# Patient Record
Sex: Male | Born: 1950 | Race: Black or African American | Hispanic: No | Smoking: Current every day smoker
Health system: Southern US, Community
[De-identification: ages and names within clinical notes are randomized; demographics above are authoritative.]

## PROBLEM LIST (undated history)

## (undated) DIAGNOSIS — C61 Malignant neoplasm of prostate: Secondary | ICD-10-CM

## (undated) DIAGNOSIS — K219 Gastro-esophageal reflux disease without esophagitis: Secondary | ICD-10-CM

## (undated) HISTORY — DX: Malignant neoplasm of prostate: C61

## (undated) HISTORY — PX: LAPAROTOMY: SHX154

---

## 1989-02-04 HISTORY — PX: BILROTH II PROCEDURE: SHX1232

## 1999-02-05 HISTORY — PX: COLONOSCOPY: SHX174

## 1999-04-26 ENCOUNTER — Other Ambulatory Visit: Admission: RE | Admit: 1999-04-26 | Discharge: 1999-04-26 | Payer: Self-pay | Admitting: Gastroenterology

## 1999-05-01 ENCOUNTER — Ambulatory Visit (HOSPITAL_COMMUNITY): Admission: RE | Admit: 1999-05-01 | Discharge: 1999-05-01 | Payer: Self-pay | Admitting: Gastroenterology

## 1999-05-01 ENCOUNTER — Encounter: Payer: Self-pay | Admitting: Gastroenterology

## 1999-05-08 ENCOUNTER — Other Ambulatory Visit: Admission: RE | Admit: 1999-05-08 | Discharge: 1999-05-08 | Payer: Self-pay | Admitting: Gastroenterology

## 2013-02-04 HISTORY — PX: OTHER SURGICAL HISTORY: SHX169

## 2014-09-17 ENCOUNTER — Other Ambulatory Visit: Payer: Self-pay | Admitting: Family

## 2015-11-24 LAB — BASIC METABOLIC PANEL: Glucose: 127 mg/dL

## 2015-12-25 ENCOUNTER — Encounter: Payer: Self-pay | Admitting: Gastroenterology

## 2016-01-11 ENCOUNTER — Ambulatory Visit: Payer: Self-pay | Admitting: Nurse Practitioner

## 2016-01-24 ENCOUNTER — Ambulatory Visit (INDEPENDENT_AMBULATORY_CARE_PROVIDER_SITE_OTHER): Payer: Non-veteran care | Admitting: Nurse Practitioner

## 2016-01-24 ENCOUNTER — Encounter: Payer: Self-pay | Admitting: Nurse Practitioner

## 2016-01-24 ENCOUNTER — Other Ambulatory Visit: Payer: Self-pay

## 2016-01-24 DIAGNOSIS — K219 Gastro-esophageal reflux disease without esophagitis: Secondary | ICD-10-CM

## 2016-01-24 DIAGNOSIS — Z86010 Personal history of colonic polyps: Secondary | ICD-10-CM | POA: Diagnosis not present

## 2016-01-24 MED ORDER — OMEPRAZOLE 20 MG PO CPDR: 20.0000 mg | DELAYED_RELEASE_CAPSULE | Freq: Every day | ORAL | 3 refills | Status: AC

## 2016-01-24 MED ORDER — PEG 3350-KCL-NA BICARB-NACL 420 G PO SOLR: 4000.0000 mL | ORAL | 0 refills | Status: DC
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
## 2016-01-24 NOTE — Patient Instructions (Signed)
1. We will schedule your procedure for you. 2. I will printout a prescription for Prilosec 20 mg. Take 1 pill, first thing in the morning, 30 minutes before your first meal the day. 3. Further recommendations to be based on the results of your colonoscopy. 4. Return for follow-up in 3 months.

## 2016-01-24 NOTE — Assessment & Plan Note (Signed)
Chronic GERD with symptoms with most meals. Currently takes Rolaids or Tums which helps his symptoms. Associated nausea. He is not currently on any prescription medications. At this point I will start him on Prilosec 20 mg once a day, 30 minutes before first meal the day. Return for follow-up in 3 months to further evaluate. Overall, no dysphagia symptoms, no other indication for upper endoscopy at this time. This could change depending on his symptom progression.

## 2016-01-24 NOTE — Assessment & Plan Note (Signed)
The patient has a history of colon polyps. Last colonoscopy 2001, 16 years ago. He is significantly overdue. He has been referred here by the [facility] due to their backlog of colonoscopy appointments. At this point he is generally asymptomatic from a GI standpoint with the exception of GERD for which we will manage as per above. We will proceed with colonoscopy as previously requested. Return for follow-up in 3 months.  Proceed with colonoscopy on propofol/MAC with Dr. Oneida Alar in the near future. The risks, benefits, and alternatives have been discussed in detail with the patient. They state understanding and desire to proceed.   The patient is not currently on any prescription medications. He drinks 2-3 drinks a day for a long period of time. Because of chronic alcohol use we will provide for the procedure on propofol/MAC to promote adequate sedation.

## 2016-01-25 NOTE — Progress Notes (Signed)
cc'ed to pcp °

## 2016-02-12 ENCOUNTER — Other Ambulatory Visit: Payer: Self-pay

## 2016-02-12 ENCOUNTER — Telehealth: Payer: Self-pay

## 2016-02-12 DIAGNOSIS — Z86010 Personal history of colonic polyps: Secondary | ICD-10-CM

## 2016-02-12 NOTE — Telephone Encounter (Signed)
Pt called office to schedule TCS. TCS with Propofol with Dr. Oneida Alar scheduled for 02/20/16 at 11:00 am. Pt already has his prep. Orders entered. Instructions mailed. Pre-op scheduled for 02/15/16 at 8:00 am. Called pt to inform of pre-op appt. Pt wanted to reschedule pre-op. Gave pt phone number for Endo scheduler to reschedule pre-op.

## 2016-02-13 NOTE — Patient Instructions (Signed)
Dominic Martin  02/13/2016     @PREFPERIOPPHARMACY @   Your procedure is scheduled on  02/20/2016  Report to [facility] at  915  A.M.  Call this number if you have problems the morning of surgery:  [phone]   Remember:  Do not eat food or drink liquids after midnight.  Take these medicines the morning of surgery with A SIP OF WATER  Prilosec   Do not wear jewelry, make-up or nail polish.  Do not wear lotions, powders, or perfumes, or deoderant.  Do not shave 48 hours prior to surgery.  Men may shave face and neck.  Do not bring valuables to the hospital.  [facility] is not responsible for any belongings or valuables.  Contacts, dentures or bridgework may not be worn into surgery.  Leave your suitcase in the car.  After surgery it may be brought to your room.  For patients admitted to the hospital, discharge time will be determined by your treatment team.  Patients discharged the day of surgery will not be allowed to drive home.   Name and phone number of your driver:   family Special instructions:  Follow the diet and prep instructions given to you by Dr Nona Dell office.  Please read over the following fact sheets that you were given. Anesthesia Post-op Instructions and Care and Recovery After Surgery       Colonoscopy, Adult A colonoscopy is an exam to look at the entire large intestine. During the exam, a lubricated, bendable tube is inserted into the anus and then passed into the rectum, colon, and other parts of the large intestine. A colonoscopy is often done as a part of normal colorectal screening or in response to certain symptoms, such as anemia, persistent diarrhea, abdominal pain, and blood in the stool. The exam can help screen for and diagnose medical problems, including:  Tumors.  Polyps.  Inflammation.  Areas of bleeding. Tell a health care provider about:  Any allergies you have.  All medicines you are taking, including vitamins,  herbs, eye drops, creams, and over-the-counter medicines.  Any problems you or family members have had with anesthetic medicines.  Any blood disorders you have.  Any surgeries you have had.  Any medical conditions you have.  Any problems you have had passing stool. What are the risks? Generally, this is a safe procedure. However, problems may occur, including:  Bleeding.  A tear in the intestine.  A reaction to medicines given during the exam.  Infection (rare). What happens before the procedure? Eating and drinking restrictions  Follow instructions from your health care provider about eating and drinking, which may include:  A few days before the procedure - follow a low-fiber diet. Avoid nuts, seeds, dried fruit, raw fruits, and vegetables.  1-3 days before the procedure - follow a clear liquid diet. Drink only clear liquids, such as clear broth or bouillon, black coffee or tea, clear juice, clear soft drinks or sports drinks, gelatin desert, and popsicles. Avoid any liquids that contain red or purple dye.  On the day of the procedure - do not eat or drink anything during the 2 hours before the procedure, or within the time period that your health care provider recommends. Bowel prep  If you were prescribed an oral bowel prep to clean out your colon:  Take it as told by your health care provider. Starting the day before your procedure, you will need to  drink a large amount of medicated liquid. The liquid will cause you to have multiple loose stools until your stool is almost clear or light green.  If your skin or anus gets irritated from diarrhea, you may use these to relieve the irritation:  Medicated wipes, such as adult wet wipes with aloe and vitamin E.  A skin soothing-product like petroleum jelly.  If you vomit while drinking the bowel prep, take a break for up to 60 minutes and then begin the bowel prep again. If vomiting continues and you cannot take the bowel prep  without vomiting, call your health care provider. General instructions  Ask your health care provider about changing or stopping your regular medicines. This is especially important if you are taking diabetes medicines or blood thinners.  Plan to have someone take you home from the hospital or clinic. What happens during the procedure?  An IV tube may be inserted into one of your veins.  You will be given medicine to help you relax (sedative).  To reduce your risk of infection:  Your health care team will wash or sanitize their hands.  Your anal area will be washed with soap.  You will be asked to lie on your side with your knees bent.  Your health care provider will lubricate a long, thin, flexible tube. The tube will have a camera and a light on the end.  The tube will be inserted into your anus.  The tube will be gently eased through your rectum and colon.  Air will be delivered into your colon to keep it open. You may feel some pressure or cramping.  The camera will be used to take images during the procedure.  A small tissue sample may be removed from your body to be examined under a microscope (biopsy). If any potential problems are found, the tissue will be sent to a lab for testing.  If small polyps are found, your health care provider may remove them and have them checked for cancer cells.  The tube that was inserted into your anus will be slowly removed. The procedure may vary among health care providers and hospitals. What happens after the procedure?  Your blood pressure, heart rate, breathing rate, and blood oxygen level will be monitored until the medicines you were given have worn off.  Do not drive for 24 hours after the exam.  You may have a small amount of blood in your stool.  You may pass gas and have mild abdominal cramping or bloating due to the air that was used to inflate your colon during the exam.  It is up to you to get the results of your  procedure. Ask your health care provider, or the department performing the procedure, when your results will be ready. This information is not intended to replace advice given to you by your health care provider. Make sure you discuss any questions you have with your health care provider. Document Released: 01/19/2000 Document Revised: 08/11/2015 Document Reviewed: 04/04/2015 Elsevier Interactive Patient Education  2017 [employer]  Colonoscopy, Adult, Care After This sheet gives you information about how to care for yourself after your procedure. Your health care provider may also give you more specific instructions. If you have problems or questions, contact your health care provider. What can I expect after the procedure? After the procedure, it is common to have:  A small amount of blood in your stool for 24 hours after the procedure.  Some gas.  Mild abdominal  cramping or bloating. Follow these instructions at home: General instructions  For the first 24 hours after the procedure:  Do not drive or use machinery.  Do not sign important documents.  Do not drink alcohol.  Do your regular daily activities at a slower pace than normal.  Eat soft, easy-to-digest foods.  Rest often.  Take over-the-counter or prescription medicines only as told by your health care provider.  It is up to you to get the results of your procedure. Ask your health care provider, or the department performing the procedure, when your results will be ready. Relieving cramping and bloating  Try walking around when you have cramps or feel bloated.  Apply heat to your abdomen as told by your health care provider. Use a heat source that your health care provider recommends, such as a moist heat pack or a heating pad.  Place a towel between your skin and the heat source.  Leave the heat on for 20-30 minutes.  Remove the heat if your skin turns bright red. This is especially important if you are  unable to feel pain, heat, or cold. You may have a greater risk of getting burned. Eating and drinking  Drink enough fluid to keep your urine clear or pale yellow.  Resume your normal diet as instructed by your health care provider. Avoid heavy or fried foods that are hard to digest.  Avoid drinking alcohol for as long as instructed by your health care provider. Contact a health care provider if:  You have blood in your stool 2-3 days after the procedure. Get help right away if:  You have more than a small spotting of blood in your stool.  You pass large blood clots in your stool.  Your abdomen is swollen.  You have nausea or vomiting.  You have a fever.  You have increasing abdominal pain that is not relieved with medicine. This information is not intended to replace advice given to you by your health care provider. Make sure you discuss any questions you have with your health care provider. Document Released: 09/05/2003 Document Revised: 10/16/2015 Document Reviewed: 04/04/2015 Elsevier Interactive Patient Education  2017 [place] Anesthesia is a term that refers to techniques, procedures, and medicines that help a person stay safe and comfortable during a medical procedure. Monitored anesthesia care, or sedation, is one type of anesthesia. Your anesthesia specialist may recommend sedation if you will be having a procedure that does not require you to be unconscious, such as:  Cataract surgery.  A dental procedure.  A biopsy.  A colonoscopy. During the procedure, you may receive a medicine to help you relax (sedative). There are three levels of sedation:  Mild sedation. At this level, you may feel awake and relaxed. You will be able to follow directions.  Moderate sedation. At this level, you will be sleepy. You may not remember the procedure.  Deep sedation. At this level, you will be asleep. You will not remember the procedure. The  more medicine you are given, the deeper your level of sedation will be. Depending on how you respond to the procedure, the anesthesia specialist may change your level of sedation or the type of anesthesia to fit your needs. An anesthesia specialist will monitor you closely during the procedure. Let your health care provider know about:  Any allergies you have.  All medicines you are taking, including vitamins, herbs, eye drops, creams, and over-the-counter medicines.  Any use of steroids (by mouth  or as a cream).  Any problems you or family members have had with sedatives and anesthetic medicines.  Any blood disorders you have.  Any surgeries you have had.  Any medical conditions you have, such as sleep apnea.  Whether you are pregnant or may be pregnant.  Any use of cigarettes, alcohol, or street drugs. What are the risks? Generally, this is a safe procedure. However, problems may occur, including:  Getting too much medicine (oversedation).  Nausea.  Allergic reaction to medicines.  Trouble breathing. If this happens, a breathing tube may be used to help with breathing. It will be removed when you are awake and breathing on your own.  Heart trouble.  Lung trouble. Before the procedure Staying hydrated  Follow instructions from your health care provider about hydration, which may include:  Up to 2 hours before the procedure - you may continue to drink clear liquids, such as water, clear fruit juice, black coffee, and plain tea. Eating and drinking restrictions  Follow instructions from your health care provider about eating and drinking, which may include:  8 hours before the procedure - stop eating heavy meals or foods such as meat, fried foods, or fatty foods.  6 hours before the procedure - stop eating light meals or foods, such as toast or cereal.  6 hours before the procedure - stop drinking milk or drinks that contain milk.  2 hours before the procedure - stop  drinking clear liquids. Medicines  Ask your health care provider about:  Changing or stopping your regular medicines. This is especially important if you are taking diabetes medicines or blood thinners.  Taking medicines such as aspirin and ibuprofen. These medicines can thin your blood. Do not take these medicines before your procedure if your health care provider instructs you not to. Tests and exams  You will have a physical exam.  You may have blood tests done to show:  How well your kidneys and liver are working.  How well your blood can clot.  General instructions  Plan to have someone take you home from the hospital or clinic.  If you will be going home right after the procedure, plan to have someone with you for 24 hours. What happens during the procedure?  Your blood pressure, heart rate, breathing, level of pain and overall condition will be monitored.  An IV tube will be inserted into one of your veins.  Your anesthesia specialist will give you medicines as needed to keep you comfortable during the procedure. This may mean changing the level of sedation.  The procedure will be performed. After the procedure  Your blood pressure, heart rate, breathing rate, and blood oxygen level will be monitored until the medicines you were given have worn off.  Do not drive for 24 hours if you received a sedative.  You may:  Feel sleepy, clumsy, or nauseous.  Feel forgetful about what happened after the procedure.  Have a sore throat if you had a breathing tube during the procedure.  Vomit. This information is not intended to replace advice given to you by your health care provider. Make sure you discuss any questions you have with your health care provider. Document Released: 10/17/2004 Document Revised: 06/30/2015 Document Reviewed: 05/14/2015 Elsevier Interactive Patient Education  2017 [place], Care After These instructions provide you  with information about caring for yourself after your procedure. Your health care provider may also give you more specific instructions. Your treatment has been planned according  to current medical practices, but problems sometimes occur. Call your health care provider if you have any problems or questions after your procedure. What can I expect after the procedure? After your procedure, it is common to:  Feel sleepy for several hours.  Feel clumsy and have poor balance for several hours.  Feel forgetful about what happened after the procedure.  Have poor judgment for several hours.  Feel nauseous or vomit.  Have a sore throat if you had a breathing tube during the procedure. Follow these instructions at home: For at least 24 hours after the procedure:   Do not:  Participate in activities in which you could fall or become injured.  Drive.  Use heavy machinery.  Drink alcohol.  Take sleeping pills or medicines that cause drowsiness.  Make important decisions or sign legal documents.  Take care of children on your own.  Rest. Eating and drinking  Follow the diet that is recommended by your health care provider.  If you vomit, drink water, juice, or soup when you can drink without vomiting.  Make sure you have little or no nausea before eating solid foods. General instructions  Have a responsible adult stay with you until you are awake and alert.  Take over-the-counter and prescription medicines only as told by your health care provider.  If you smoke, do not smoke without supervision.  Keep all follow-up visits as told by your health care provider. This is important. Contact a health care provider if:  You keep feeling nauseous or you keep vomiting.  You feel light-headed.  You develop a rash.  You have a fever. Get help right away if:  You have trouble breathing. This information is not intended to replace advice given to you by your health care provider.  Make sure you discuss any questions you have with your health care provider. Document Released: 05/14/2015 Document Revised: 09/13/2015 Document Reviewed: 05/14/2015 Elsevier Interactive Patient Education  2017 [employer].

## 2016-02-15 ENCOUNTER — Other Ambulatory Visit (HOSPITAL_COMMUNITY): Payer: No Typology Code available for payment source

## 2016-02-16 ENCOUNTER — Encounter (HOSPITAL_COMMUNITY)
Admission: RE | Admit: 2016-02-16 | Discharge: 2016-02-16 | Disposition: A | Discharge status: Home / Self Care | Payer: Non-veteran care | Source: Ambulatory Visit | Attending: Gastroenterology | Admitting: Gastroenterology

## 2016-02-16 ENCOUNTER — Encounter (HOSPITAL_COMMUNITY): Payer: Self-pay

## 2016-02-16 DIAGNOSIS — K635 Polyp of colon: Secondary | ICD-10-CM | POA: Diagnosis not present

## 2016-02-16 DIAGNOSIS — K219 Gastro-esophageal reflux disease without esophagitis: Secondary | ICD-10-CM | POA: Diagnosis not present

## 2016-02-16 DIAGNOSIS — Z01812 Encounter for preprocedural laboratory examination: Secondary | ICD-10-CM | POA: Diagnosis present

## 2016-02-16 DIAGNOSIS — C61 Malignant neoplasm of prostate: Secondary | ICD-10-CM | POA: Diagnosis not present

## 2016-02-16 HISTORY — DX: Gastro-esophageal reflux disease without esophagitis: K21.9

## 2016-02-16 LAB — BASIC METABOLIC PANEL
Anion gap: 7 (ref 5–15)
BUN: 9 mg/dL (ref 6–20)
CALCIUM: 9.1 mg/dL (ref 8.9–10.3)
CHLORIDE: 103 mmol/L (ref 101–111)
CO2: 24 mmol/L (ref 22–32)
CREATININE: 0.85 mg/dL (ref 0.61–1.24)
GFR calc Af Amer: >60 mL/min (ref >60)
GFR calc non Af Amer: >60 mL/min (ref >60)
Glucose, Bld: 103 mg/dL — ABNORMAL HIGH (ref 65–99)
Potassium: 3.4 mmol/L — ABNORMAL LOW (ref 3.5–5.1)
Sodium: 134 mmol/L — ABNORMAL LOW (ref 135–145)

## 2016-02-16 LAB — CBC WITH DIFFERENTIAL/PLATELET
BASOS PCT: 0 %
Basophils Absolute: 0 10*3/uL (ref 0.0–0.1)
Eosinophils Absolute: 0.1 10*3/uL (ref 0.0–0.7)
Eosinophils Relative: 1 %
HEMATOCRIT: 40.8 % (ref 39.0–52.0)
HEMOGLOBIN: 14.5 g/dL (ref 13.0–17.0)
LYMPHS ABS: 2.7 10*3/uL (ref 0.7–4.0)
Lymphocytes Relative: 36 %
MCH: 34.5 pg — AB (ref 26.0–34.0)
MCHC: 35.5 g/dL (ref 30.0–36.0)
MCV: 97.1 fL (ref 78.0–100.0)
MONO ABS: 0.8 10*3/uL (ref 0.1–1.0)
MONOS PCT: 10 %
NEUTROS PCT: 53 %
Neutro Abs: 4 10*3/uL (ref 1.7–7.7)
Platelets: 253 10*3/uL (ref 150–400)
RBC: 4.2 MIL/uL — ABNORMAL LOW (ref 4.22–5.81)
RDW: 14.5 % (ref 11.5–15.5)
WBC: 7.6 10*3/uL (ref 4.0–10.5)

## 2016-02-20 ENCOUNTER — Ambulatory Visit (HOSPITAL_COMMUNITY): Payer: Non-veteran care | Admitting: Anesthesiology

## 2016-02-20 ENCOUNTER — Encounter (HOSPITAL_COMMUNITY): Payer: Self-pay

## 2016-02-20 ENCOUNTER — Encounter (HOSPITAL_COMMUNITY)
Admission: RE | Disposition: A | Discharge status: Home / Self Care | Payer: Self-pay | Source: Ambulatory Visit | Attending: Gastroenterology

## 2016-02-20 ENCOUNTER — Ambulatory Visit (HOSPITAL_COMMUNITY)
Admission: RE | Admit: 2016-02-20 | Discharge: 2016-02-20 | Disposition: A | Discharge status: Home / Self Care | Payer: Non-veteran care | Source: Ambulatory Visit | Attending: Gastroenterology | Admitting: Gastroenterology

## 2016-02-20 DIAGNOSIS — K219 Gastro-esophageal reflux disease without esophagitis: Secondary | ICD-10-CM | POA: Diagnosis not present

## 2016-02-20 DIAGNOSIS — Z86010 Personal history of colonic polyps: Secondary | ICD-10-CM

## 2016-02-20 DIAGNOSIS — K648 Other hemorrhoids: Secondary | ICD-10-CM | POA: Diagnosis not present

## 2016-02-20 DIAGNOSIS — Z1211 Encounter for screening for malignant neoplasm of colon: Secondary | ICD-10-CM | POA: Insufficient documentation

## 2016-02-20 DIAGNOSIS — D124 Benign neoplasm of descending colon: Secondary | ICD-10-CM | POA: Diagnosis not present

## 2016-02-20 DIAGNOSIS — Z8719 Personal history of other diseases of the digestive system: Secondary | ICD-10-CM | POA: Insufficient documentation

## 2016-02-20 DIAGNOSIS — F17210 Nicotine dependence, cigarettes, uncomplicated: Secondary | ICD-10-CM | POA: Diagnosis not present

## 2016-02-20 DIAGNOSIS — D120 Benign neoplasm of cecum: Secondary | ICD-10-CM | POA: Diagnosis not present

## 2016-02-20 DIAGNOSIS — Q439 Congenital malformation of intestine, unspecified: Secondary | ICD-10-CM | POA: Insufficient documentation

## 2016-02-20 HISTORY — PX: COLONOSCOPY WITH PROPOFOL: SHX5780

## 2016-02-20 HISTORY — PX: POLYPECTOMY: SHX5525

## 2016-02-20 SURGERY — COLONOSCOPY WITH PROPOFOL
Anesthesia: Monitor Anesthesia Care

## 2016-02-20 MED ORDER — MIDAZOLAM HCL 2 MG/2ML IJ SOLN
INTRAMUSCULAR | Status: AC
  Filled 2016-02-20: qty 2

## 2016-02-20 MED ORDER — MIDAZOLAM HCL 5 MG/5ML IJ SOLN
INTRAMUSCULAR | Status: DC | PRN
  Administered 2016-02-20: 2 mg via INTRAVENOUS

## 2016-02-20 MED ORDER — PROPOFOL 10 MG/ML IV BOLUS
INTRAVENOUS | Status: AC
  Filled 2016-02-20: qty 40

## 2016-02-20 MED ORDER — CHLORHEXIDINE GLUCONATE CLOTH 2 % EX PADS: 6.0000 | MEDICATED_PAD | Freq: Once | CUTANEOUS | Status: DC

## 2016-02-20 MED ORDER — LACTATED RINGERS IV SOLN
INTRAVENOUS | Status: DC
  Administered 2016-02-20: 10:00:00 via INTRAVENOUS

## 2016-02-20 MED ORDER — FENTANYL CITRATE (PF) 100 MCG/2ML IJ SOLN
INTRAMUSCULAR | Status: AC
  Filled 2016-02-20: qty 2

## 2016-02-20 MED ORDER — PROPOFOL 500 MG/50ML IV EMUL
INTRAVENOUS | Status: DC | PRN
  Administered 2016-02-20: 125 ug/kg/min via INTRAVENOUS
  Administered 2016-02-20: 11:00:00 via INTRAVENOUS

## 2016-02-20 MED ORDER — MIDAZOLAM HCL 2 MG/2ML IJ SOLN
0.5000 mg | INTRAMUSCULAR | Status: DC | PRN
  Administered 2016-02-20: 2 mg via INTRAVENOUS
  Filled 2016-02-20: qty 2

## 2016-02-20 MED ORDER — FENTANYL CITRATE (PF) 100 MCG/2ML IJ SOLN
25.0000 ug | INTRAMUSCULAR | Status: AC | PRN
  Administered 2016-02-20 (×2): 25 ug via INTRAVENOUS

## 2016-02-20 NOTE — Discharge Instructions (Signed)
You had 2 polyps removed. You have MODERATE SIZE internal hemorrhoids.    DRINK WATER TO KEEP YOUR URINE LIGHT YELLOW.  FOLLOW A HIGH FIBER DIET. AVOID ITEMS THAT CAUSE BLOATING & GAS. SEE INFO BELOW.  YOUR BIOPSY RESULTS WILL BE AVAILABLE IN MY CHART AFTER JAN 19 AND MY OFFICE WILL CONTACT YOU IN 10-14 DAYS WITH YOUR RESULTS.   Next colonoscopy in 5-10 years.    Colonoscopy Care After Read the instructions outlined below and refer to this sheet in the next week. These discharge instructions provide you with general information on caring for yourself after you leave the hospital. While your treatment has been planned according to the most current medical practices available, unavoidable complications occasionally occur. If you have any problems or questions after discharge, call DR. Vaibhav Fogleman, [phone].  ACTIVITY  You may resume your regular activity, but move at a slower pace for the next 24 hours.   Take frequent rest periods for the next 24 hours.   Walking will help get rid of the air and reduce the bloated feeling in your belly (abdomen).   No driving for 24 hours (because of the medicine (anesthesia) used during the test).   You may shower.   Do not sign any important legal documents or operate any machinery for 24 hours (because of the anesthesia used during the test).    NUTRITION  Drink plenty of fluids.   You may resume your normal diet as instructed by your doctor.   Begin with a light meal and progress to your normal diet. Heavy or fried foods are harder to digest and may make you feel sick to your stomach (nauseated).   Avoid alcoholic beverages for 24 hours or as instructed.    MEDICATIONS  You may resume your normal medications.   WHAT YOU CAN EXPECT TODAY  Some feelings of bloating in the abdomen.   Passage of more gas than usual.   Spotting of blood in your stool or on the toilet paper  .  IF YOU HAD POLYPS REMOVED DURING THE  COLONOSCOPY:  Eat a soft diet IF YOU HAVE NAUSEA, BLOATING, ABDOMINAL PAIN, OR VOMITING.    FINDING OUT THE RESULTS OF YOUR TEST Not all test results are available during your visit. DR. Oneida Alar WILL CALL YOU WITHIN 14 DAYS OF YOUR PROCEDUE WITH YOUR RESULTS. Do not assume everything is normal if you have not heard from DR. Prisma Decarlo, CALL HER OFFICE AT [phone].  SEEK IMMEDIATE MEDICAL ATTENTION AND CALL THE OFFICE: [phone] IF:  You have more than a spotting of blood in your stool.   Your belly is swollen (abdominal distention).   You are nauseated or vomiting.   You have a temperature over 101F.   You have abdominal pain or discomfort that is severe or gets worse throughout the day.   High-Fiber Diet A high-fiber diet changes your normal diet to include more whole grains, legumes, fruits, and vegetables. Changes in the diet involve replacing refined carbohydrates with unrefined foods. The calorie level of the diet is essentially unchanged. The Dietary Reference Intake (recommended amount) for adult males is 38 grams per day. For adult females, it is 25 grams per day. Pregnant and lactating women should consume 28 grams of fiber per day. Fiber is the intact part of a plant that is not broken down during digestion. Functional fiber is fiber that has been isolated from the plant to provide a beneficial effect in the body. PURPOSE  Increase stool bulk.  Ease and regulate bowel movements.   Lower cholesterol.   REDUCE RISK OF COLON CANCER  INDICATIONS THAT YOU NEED MORE FIBER  Constipation and hemorrhoids.   Uncomplicated diverticulosis (intestine condition) and irritable bowel syndrome.   Weight management.   As a protective measure against hardening of the arteries (atherosclerosis), diabetes, and cancer.   GUIDELINES FOR INCREASING FIBER IN THE DIET  Start adding fiber to the diet slowly. A gradual increase of about 5 more grams (2 slices of whole-wheat bread, 2  servings of most fruits or vegetables, or 1 bowl of high-fiber cereal) per day is best. Too rapid an increase in fiber may result in constipation, flatulence, and bloating.   Drink enough water and fluids to keep your urine clear or pale yellow. Water, juice, or caffeine-free drinks are recommended. Not drinking enough fluid may cause constipation.   Eat a variety of high-fiber foods rather than one type of fiber.   Try to increase your intake of fiber through using high-fiber foods rather than fiber pills or supplements that contain small amounts of fiber.   The goal is to change the types of food eaten. Do not supplement your present diet with high-fiber foods, but replace foods in your present diet.   INCLUDE A VARIETY OF FIBER SOURCES  Replace refined and processed grains with whole grains, canned fruits with fresh fruits, and incorporate other fiber sources. White rice, white breads, and most bakery goods contain little or no fiber.   Brown whole-grain rice, buckwheat oats, and many fruits and vegetables are all good sources of fiber. These include: broccoli, Brussels sprouts, cabbage, cauliflower, beets, sweet potatoes, white potatoes (skin on), carrots, tomatoes, eggplant, squash, berries, fresh fruits, and dried fruits.   Cereals appear to be the richest source of fiber. Cereal fiber is found in whole grains and bran. Bran is the fiber-rich outer coat of cereal grain, which is largely removed in refining. In whole-grain cereals, the bran remains. In breakfast cereals, the largest amount of fiber is found in those with "bran" in their names. The fiber content is sometimes indicated on the label.   You may need to include additional fruits and vegetables each day.   In baking, for 1 cup white flour, you may use the following substitutions:   1 cup whole-wheat flour minus 2 tablespoons.   1/2 cup white flour plus 1/2 cup whole-wheat flour.   Polyps, Colon  A polyp is extra tissue that  grows inside your body. Colon polyps grow in the large intestine. The large intestine, also called the colon, is part of your digestive system. It is a long, hollow tube at the end of your digestive tract where your body makes and stores stool. Most polyps are not dangerous. They are benign. This means they are not cancerous. But over time, some types of polyps can turn into cancer. Polyps that are smaller than a pea are usually not harmful. But larger polyps could someday become or may already be cancerous. To be safe, doctors remove all polyps and test them.   WHO GETS POLYPS? Anyone can get polyps, but certain people are more likely than others. You may have a greater chance of getting polyps if:  You are over 50.   You have had polyps before.   Someone in your family has had polyps.   Someone in your family has had cancer of the large intestine.   Find out if someone in your family has had polyps. You may also be  more likely to get polyps if you:   Eat a lot of fatty foods   Smoke   Drink alcohol   Do not exercise  Eat too much   PREVENTION There is not one sure way to prevent polyps. You might be able to lower your risk of getting them if you:  Eat more fruits and vegetables and less fatty food.   Do not smoke.   Avoid alcohol.   Exercise every day.   Lose weight if you are overweight.   Eating more calcium and folate can also lower your risk of getting polyps. Some foods that are rich in calcium are milk, cheese, and broccoli. Some foods that are rich in folate are chickpeas, kidney beans, and spinach.   Hemorrhoids Hemorrhoids are dilated (enlarged) veins around the rectum. Sometimes clots will form in the veins. This makes them swollen and painful. These are called thrombosed hemorrhoids. Causes of hemorrhoids include:  Constipation.   Straining to have a bowel movement.   HEAVY LIFTING  HOME CARE INSTRUCTIONS  Eat a well balanced diet and drink 6 to 8 glasses  of water every day to avoid constipation. You may also use a bulk laxative.   Avoid straining to have bowel movements.   Keep anal area dry and clean.   Do not use a donut shaped pillow or sit on the toilet for long periods. This increases blood pooling and pain.   Move your bowels when your body has the urge; this will require less straining and will decrease pain and pressure.
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
## 2016-02-20 NOTE — Op Note (Signed)
[facility] Patient Name: Dominic Martin Procedure Date: 02/20/2016 10:46 AM MRN: [id number] Date of Birth: [birth year] Attending MD: Barney Drain , MD CSN: [id number] Age: 66 Admit Type: Outpatient Procedure:                Colonoscopy WITH COLD FORCEPS/SNARE POLYPECTOMY Indications:              Surveillance: Personal history of colonic polyps                            (unknown histology) on last colonoscopy more than 5                            years ago Providers:                Barney Drain, MD, Otis Peak B. Sharon Seller, RN, Randa Spike, Technician Referring MD:             [place] Medicines:                Propofol per Anesthesia Complications:            No immediate complications. Estimated Blood Loss:     Estimated blood loss was minimal. Procedure:                Pre-Anesthesia Assessment:                           - Prior to the procedure, a History and Physical                            was performed, and patient medications and                            allergies were reviewed. The patient's tolerance of                            previous anesthesia was also reviewed. The risks                            and benefits of the procedure and the sedation                            options and risks were discussed with the patient.                            All questions were answered, and informed consent                            was obtained. Prior Anticoagulants: The patient has                            taken no previous anticoagulant or antiplatelet  agents. ASA Grade Assessment: I - A normal, healthy                            patient. After reviewing the risks and benefits,                            the patient was deemed in satisfactory condition to                            undergo the procedure. After obtaining informed                            consent, the colonoscope was passed under  direct                            vision. Throughout the procedure, the patient's                            blood pressure, pulse, and oxygen saturations were                            monitored continuously. The [id number]                            scope was introduced through the anus and advanced                            to the the cecum, identified by appendiceal orifice                            and ileocecal valve. The ileocecal valve,                            appendiceal orifice, and rectum were photographed.                            The colonoscopy was somewhat difficult due to a                            tortuous colon. Successful completion of the                            procedure was aided by COLOWRAP. The patient                            tolerated the procedure well. The quality of the                            bowel preparation was excellent. Scope In: 10:55:16 AM Scope Out: 11:14:31 AM Scope Withdrawal Time: 0 hours 16 minutes 2 seconds  Total Procedure Duration: 0 hours 19 minutes 15 seconds  Findings:      A 6 mm polyp was found in the cecum. The polyp was sessile. The polyp  was removed with a hot snare(20W). Resection and retrieval were complete.      A 3 mm polyp was found in the mid descending colon. The polyp was       sessile. The polyp was removed with a cold biopsy forceps. Resection and       retrieval were complete.      Internal hemorrhoids were found during retroflexion. The hemorrhoids       were moderate. Impression:               - One 6 mm polyp in the cecum, removed with a hot                            snare. Resected and retrieved.                           - One 3 mm polyp in the mid descending colon,                            removed with a cold biopsy forceps. Resected and                            retrieved.                           - Internal hemorrhoids. Moderate Sedation:      Per Anesthesia  Care Recommendation:           - High fiber diet.                           - Continue present medications.                           - Await pathology results.                           - Repeat colonoscopy in 5-10 years for surveillance.                           - Patient has a contact number available for                            emergencies. The signs and symptoms of potential                            delayed complications were discussed with the                            patient. Return to normal activities tomorrow.                            Written discharge instructions were provided to the                            patient. Procedure Code(s):        --- Professional ---  45385, Colonoscopy, flexible; with removal of                            tumor(s), polyp(s), or other lesion(s) by snare                            technique                           45380, 59, Colonoscopy, flexible; with biopsy,                            single or multiple Diagnosis Code(s):        --- Professional ---                           Z86.010, Personal history of colonic polyps                           D12.0, Benign neoplasm of cecum                           D12.4, Benign neoplasm of descending colon                           K64.8, Other hemorrhoids CPT copyright 2016 American [employer] Association. All rights reserved. The codes documented in this report are preliminary and upon coder review may  be revised to meet current compliance requirements. Barney Drain, MD Barney Drain, MD 02/20/2016 11:29:04 AM This report has been signed electronically. Number of Addenda: 0

## 2016-02-20 NOTE — Anesthesia Postprocedure Evaluation (Signed)
Anesthesia Post Note  Patient: Dominic Martin  Procedure(s) Performed: Procedure(s) (LRB): COLONOSCOPY WITH PROPOFOL (N/A) POLYPECTOMY  Patient location during evaluation: PACU Anesthesia Type: MAC Level of consciousness: awake, oriented and patient cooperative Pain management: pain level controlled Vital Signs Assessment: post-procedure vital signs reviewed and stable Respiratory status: spontaneous breathing, nonlabored ventilation and respiratory function stable Cardiovascular status: blood pressure returned to baseline Postop Assessment: no signs of nausea or vomiting Anesthetic complications: no     Last Vitals:  Vitals:   02/20/16 1035 02/20/16 1122  BP: 112/79 92/67  Pulse:  74  Resp: (!) 0 16  Temp:  36.6 C    Last Pain: There were no vitals filed for this visit.               Meara Wiechman J

## 2016-02-20 NOTE — Anesthesia Preprocedure Evaluation (Signed)
Anesthesia Evaluation  Patient identified by MRN, date of birth, ID band Patient awake    Reviewed: Allergy & Precautions, NPO status , Patient's Chart, lab work & pertinent test results  Airway Mallampati: I  TM Distance: >3 FB     Dental  (+) Teeth Intact   Pulmonary Current Smoker (am cough),    breath sounds clear to auscultation       Cardiovascular negative cardio ROS   Rhythm:Regular Rate:Normal     Neuro/Psych    GI/Hepatic GERD  ,  Endo/Other    Renal/GU      Musculoskeletal   Abdominal   Peds  Hematology   Anesthesia Other Findings   Reproductive/Obstetrics                             Anesthesia Physical Anesthesia Plan  ASA: II  Anesthesia Plan: MAC   Post-op Pain Management:    Induction: Intravenous  Airway Management Planned: Simple Face Mask  Additional Equipment:   Intra-op Plan:   Post-operative Plan:   Informed Consent: I have reviewed the patients History and Physical, chart, labs and discussed the procedure including the risks, benefits and alternatives for the proposed anesthesia with the patient or authorized representative who has indicated his/her understanding and acceptance.     Plan Discussed with:   Anesthesia Plan Comments:         Anesthesia Quick Evaluation

## 2016-02-20 NOTE — Transfer of Care (Signed)
Immediate Anesthesia Transfer of Care Note  Patient: Dominic Martin  Procedure(s) Performed: Procedure(s) with comments: COLONOSCOPY WITH PROPOFOL (N/A) - 11:00 am POLYPECTOMY - colon  Patient Location: PACU  Anesthesia Type:MAC  Level of Consciousness: sedated  Airway & Oxygen Therapy: Patient Spontanous Breathing and Patient connected to face mask oxygen  Post-op Assessment: Report given to RN, Post -op Vital signs reviewed and stable and Patient moving all extremities  Post vital signs: Reviewed and stable  Last Vitals:  Vitals:   02/20/16 1030 02/20/16 1035  BP: 106/72 112/79  Pulse:    Resp: 18 (!) 0  Temp:      Last Pain: There were no vitals filed for this visit.    Patients Stated Pain Goal: 4 (22/48/25 0037)  Complications: No apparent anesthesia complications

## 2016-02-22 ENCOUNTER — Encounter (HOSPITAL_COMMUNITY): Payer: Self-pay | Admitting: Gastroenterology

## 2016-02-26 ENCOUNTER — Telehealth: Payer: Self-pay | Admitting: Gastroenterology

## 2016-02-26 NOTE — Telephone Encounter (Signed)
Please call pt. HE had simple TWO adenomas removed.   DRINK WATER TO KEEP YOUR URINE LIGHT YELLOW.  FOLLOW A HIGH FIBER DIET. AVOID ITEMS THAT CAUSE BLOATING & GAS.   Next colonoscopy in 5-10 years.

## 2016-02-26 NOTE — Telephone Encounter (Signed)
LMOM to call.

## 2016-02-26 NOTE — Telephone Encounter (Signed)
Tried to call with no answer  

## 2016-02-26 NOTE — Telephone Encounter (Signed)
Reminder in epic °

## 2016-03-05 NOTE — Telephone Encounter (Signed)
PT called and is aware of his results. High Fiber diet mailed to him.

## 2016-03-20 ENCOUNTER — Telehealth: Payer: Self-pay

## 2016-03-20 NOTE — Telephone Encounter (Signed)
Pt had listed to old VM and he is aware of results and has received his diet in the mail.

## 2016-03-20 NOTE — Telephone Encounter (Signed)
PATIENT CALLED AND STATED HE HAD A MESSAGE THAT WE HAD INFO FOR HIM.  PLEASE CALL HIM BACK.

## 2016-03-20 NOTE — Telephone Encounter (Signed)
I left Vm for a return call. I had spoke to pt on 03/05/2016 and gave him results from colonoscopy and mailed him a letter. Don't see that anyone has called him since then.

## 2016-04-23 ENCOUNTER — Ambulatory Visit: Payer: No Typology Code available for payment source | Admitting: Nurse Practitioner

## 2016-05-13 ENCOUNTER — Encounter: Payer: Self-pay | Admitting: Nurse Practitioner

## 2016-05-13 ENCOUNTER — Ambulatory Visit (INDEPENDENT_AMBULATORY_CARE_PROVIDER_SITE_OTHER): Payer: Non-veteran care | Admitting: Nurse Practitioner

## 2016-05-13 VITALS — BP 124/85 | HR 96 | Temp 98.3°F | Ht 72.0 in | Wt 149.0 lb

## 2016-05-13 DIAGNOSIS — K219 Gastro-esophageal reflux disease without esophagitis: Secondary | ICD-10-CM

## 2016-05-13 DIAGNOSIS — Z86010 Personal history of colonic polyps: Secondary | ICD-10-CM | POA: Diagnosis not present
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
## 2016-05-13 NOTE — Assessment & Plan Note (Signed)
History of multiple colon polyps. Colonoscopy completed which found 2 polyps and recommended repeat exam in 5-10 years. No other overt GI symptoms. Return for follow-up as needed.

## 2016-05-13 NOTE — Patient Instructions (Signed)
1. Keep taking your acid blocker medicine. 2. Avoid eating within 3 hours of going to sleep to prevent breakthrough heartburn at night. 3. Return for follow-up in 6 months. 4. Call if you have any worsening symptoms before then.

## 2016-05-13 NOTE — Progress Notes (Signed)
cc'ed to pcp °

## 2016-05-13 NOTE — Assessment & Plan Note (Signed)
GERD symptoms relatively well controlled. Only breakthrough is when he eats relatively soon before laying down. I have advised him to not consume food within 2-4 hours of lying flat. Otherwise, continue PPI. Return for follow-up in 6 months

## 2016-05-20 NOTE — Progress Notes (Signed)
REVIEWED-NO ADDITIONAL RECOMMENDATIONS. 

## 2016-06-11 ENCOUNTER — Other Ambulatory Visit: Payer: Self-pay | Admitting: Urology

## 2016-06-11 DIAGNOSIS — R9721 Rising PSA following treatment for malignant neoplasm of prostate: Secondary | ICD-10-CM

## 2016-06-25 ENCOUNTER — Encounter (HOSPITAL_COMMUNITY): Payer: Self-pay

## 2016-06-25 ENCOUNTER — Ambulatory Visit (HOSPITAL_COMMUNITY)
Admission: RE | Admit: 2016-06-25 | Discharge: 2016-06-25 | Disposition: A | Discharge status: Home / Self Care | Payer: Self-pay | Source: Ambulatory Visit | Attending: Urology | Admitting: Urology

## 2016-06-25 DIAGNOSIS — R9721 Rising PSA following treatment for malignant neoplasm of prostate: Secondary | ICD-10-CM | POA: Insufficient documentation

## 2016-06-25 DIAGNOSIS — R972 Elevated prostate specific antigen [PSA]: Secondary | ICD-10-CM | POA: Diagnosis not present

## 2016-06-25 MED ORDER — LIDOCAINE HCL (PF) 2 % IJ SOLN
INTRAMUSCULAR | Status: AC
  Administered 2016-06-25: 10 mL
  Filled 2016-06-25: qty 10

## 2016-06-25 MED ORDER — GENTAMICIN SULFATE 40 MG/ML IJ SOLN
INTRAMUSCULAR | Status: AC
  Administered 2016-06-25: 160 mg via INTRAMUSCULAR
  Filled 2016-06-25: qty 4

## 2016-06-25 MED ORDER — GENTAMICIN SULFATE 40 MG/ML IJ SOLN
160.0000 mg | Freq: Once | INTRAMUSCULAR | Status: AC
  Administered 2016-06-25: 160 mg via INTRAMUSCULAR

## 2016-06-25 MED ORDER — LIDOCAINE HCL (PF) 2 % IJ SOLN
10.0000 mL | Freq: Once | INTRAMUSCULAR | Status: AC
  Administered 2016-06-25: 10 mL

## 2016-07-16 ENCOUNTER — Other Ambulatory Visit: Payer: Self-pay | Admitting: Urology

## 2016-07-16 DIAGNOSIS — C61 Malignant neoplasm of prostate: Secondary | ICD-10-CM

## 2016-08-01 ENCOUNTER — Encounter (HOSPITAL_COMMUNITY): Payer: Non-veteran care

## 2016-08-30 ENCOUNTER — Encounter (HOSPITAL_COMMUNITY)
Admission: RE | Admit: 2016-08-30 | Discharge: 2016-08-30 | Disposition: A | Discharge status: Home / Self Care | Payer: Non-veteran care | Source: Ambulatory Visit | Attending: Urology | Admitting: Urology

## 2016-08-30 DIAGNOSIS — C61 Malignant neoplasm of prostate: Secondary | ICD-10-CM

## 2016-08-30 MED ORDER — AXUMIN (FLUCICLOVINE F 18) INJECTION
10.2200 | Freq: Once | INTRAVENOUS | Status: AC
  Administered 2016-08-30: 10.22 via INTRAVENOUS

## 2016-11-12 ENCOUNTER — Telehealth: Payer: Self-pay | Admitting: Nurse Practitioner

## 2016-11-12 NOTE — Telephone Encounter (Signed)
Patient [employer] auth expired in august and Robin at the [place] stated he will need another office visit there for further referral approvals.  I called and spoke to the patient and he stated he had an upcoming appt with them and to go ahead and cancel the appt for tomorrow and he would be back with [place] after seeing them

## 2016-11-12 NOTE — Telephone Encounter (Signed)
Noted  

## 2016-11-13 ENCOUNTER — Ambulatory Visit: Payer: No Typology Code available for payment source | Admitting: Nurse Practitioner

## 2019-02-08 DIAGNOSIS — H524 Presbyopia: Secondary | ICD-10-CM | POA: Diagnosis not present

## 2019-02-08 DIAGNOSIS — H2513 Age-related nuclear cataract, bilateral: Secondary | ICD-10-CM | POA: Diagnosis not present

## 2019-02-08 DIAGNOSIS — H35362 Drusen (degenerative) of macula, left eye: Secondary | ICD-10-CM | POA: Diagnosis not present

## 2019-05-21 DIAGNOSIS — R69 Illness, unspecified: Secondary | ICD-10-CM | POA: Diagnosis not present

## 2019-05-21 DIAGNOSIS — Z13220 Encounter for screening for lipoid disorders: Secondary | ICD-10-CM | POA: Diagnosis not present

## 2019-05-21 DIAGNOSIS — K4090 Unilateral inguinal hernia, without obstruction or gangrene, not specified as recurrent: Secondary | ICD-10-CM | POA: Diagnosis not present

## 2019-05-21 DIAGNOSIS — I10 Essential (primary) hypertension: Secondary | ICD-10-CM | POA: Diagnosis not present

## 2020-05-17 ENCOUNTER — Other Ambulatory Visit: Payer: Self-pay | Admitting: Oncology

## 2020-05-17 ENCOUNTER — Other Ambulatory Visit (HOSPITAL_COMMUNITY): Payer: Self-pay | Admitting: Oncology

## 2020-05-17 DIAGNOSIS — D491 Neoplasm of unspecified behavior of respiratory system: Secondary | ICD-10-CM

## 2020-05-30 ENCOUNTER — Encounter (HOSPITAL_COMMUNITY)
Admission: RE | Admit: 2020-05-30 | Discharge: 2020-05-30 | Disposition: A | Discharge status: Home / Self Care | Payer: Medicare Other | Source: Ambulatory Visit | Attending: Oncology | Admitting: Oncology

## 2020-05-30 ENCOUNTER — Other Ambulatory Visit: Payer: Self-pay

## 2020-05-30 DIAGNOSIS — R590 Localized enlarged lymph nodes: Secondary | ICD-10-CM | POA: Diagnosis not present

## 2020-05-30 DIAGNOSIS — I700 Atherosclerosis of aorta: Secondary | ICD-10-CM | POA: Diagnosis not present

## 2020-05-30 DIAGNOSIS — D491 Neoplasm of unspecified behavior of respiratory system: Secondary | ICD-10-CM | POA: Diagnosis present

## 2020-05-30 LAB — GLUCOSE, CAPILLARY: Glucose-Capillary: 122 mg/dL — ABNORMAL HIGH (ref 70–99)

## 2020-05-30 MED ORDER — FLUDEOXYGLUCOSE F - 18 (FDG) INJECTION
7.5000 | Freq: Once | INTRAVENOUS | Status: AC
  Administered 2020-05-30: 7.85 via INTRAVENOUS

## 2020-06-15 ENCOUNTER — Encounter (HOSPITAL_COMMUNITY): Payer: Self-pay

## 2020-09-05 ENCOUNTER — Other Ambulatory Visit (HOSPITAL_COMMUNITY): Payer: Self-pay | Admitting: Hematology and Oncology

## 2020-09-05 DIAGNOSIS — R918 Other nonspecific abnormal finding of lung field: Secondary | ICD-10-CM

## 2020-09-07 ENCOUNTER — Other Ambulatory Visit: Payer: Self-pay

## 2020-09-07 ENCOUNTER — Encounter (HOSPITAL_COMMUNITY)
Admission: RE | Admit: 2020-09-07 | Discharge: 2020-09-07 | Disposition: A | Discharge status: Home / Self Care | Payer: Medicare Other | Source: Ambulatory Visit | Attending: Hematology and Oncology | Admitting: Hematology and Oncology

## 2020-09-07 DIAGNOSIS — R918 Other nonspecific abnormal finding of lung field: Secondary | ICD-10-CM

## 2020-09-07 MED ORDER — FLUDEOXYGLUCOSE F - 18 (FDG) INJECTION
10.0900 | Freq: Once | INTRAVENOUS | Status: AC | PRN
  Administered 2020-09-07: 7.23 via INTRAVENOUS

## 2021-02-07 ENCOUNTER — Encounter: Payer: Self-pay | Admitting: *Deleted
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
## 2022-09-05 DEATH — deceased
